# Patient Record
Sex: Female | Born: 1980 | Race: White | Hispanic: No | Marital: Married | Smoking: Never smoker
Health system: Southern US, Community
[De-identification: ages and names within clinical notes are randomized; demographics above are authoritative.]

## PROBLEM LIST (undated history)

## (undated) DIAGNOSIS — I10 Essential (primary) hypertension: Secondary | ICD-10-CM

## (undated) HISTORY — PX: CHOLECYSTECTOMY: SHX55

---

## 2005-04-21 ENCOUNTER — Inpatient Hospital Stay (HOSPITAL_COMMUNITY): Admission: AD | Admit: 2005-04-21 | Discharge: 2005-04-23 | Payer: Self-pay | Admitting: Obstetrics & Gynecology

## 2005-07-22 ENCOUNTER — Inpatient Hospital Stay (HOSPITAL_COMMUNITY): Admission: EM | Admit: 2005-07-22 | Discharge: 2005-07-24 | Payer: Self-pay | Admitting: Emergency Medicine

## 2005-07-23 ENCOUNTER — Encounter (INDEPENDENT_AMBULATORY_CARE_PROVIDER_SITE_OTHER): Payer: Self-pay | Admitting: General Surgery

## 2006-02-21 ENCOUNTER — Other Ambulatory Visit: Admission: RE | Admit: 2006-02-21 | Discharge: 2006-02-21 | Payer: Self-pay | Admitting: Obstetrics & Gynecology

## 2006-08-12 IMAGING — CT CT PELVIS W/ CM
1 of 3 series · 14 of 32 positions shown, 19 images · IV contrast (CONTRAST)
Comparison: none

CLINICAL DATA: Mid upper abdominal pain.
 ABDOMEN CT WITH CONTRAST:
TECHNIQUE: Multidetector CT imaging of the abdomen was performed following the standard protocol during bolus administration of intravenous contrast.
 Contrast:  150 cc Omnipaque 300.
 The liver, spleen, pancreas, adrenal glands, and kidneys appear normal.  There are no dilated loops of large or small bowel. There is a tiny umbilical hernia containing only fat. Terminal ileum is normal.
TECHNIQUE: Multidetector CT imaging of the pelvis was performed following the standard protocol during bolus administration of intravenous contrast.
 There is a 3.4 cm cyst on the right ovary and a 4.0 cm cyst on the left ovary.  The uterus is normal.  No free fluid or other significant abnormality.  The appendix appears normal.

[Series 6210: — · axial · 0.88mm/px · z∈[+1380,+1845]mm · 14 of 105 slices shown, 19 images]
[im 6/105  soft-tissue]
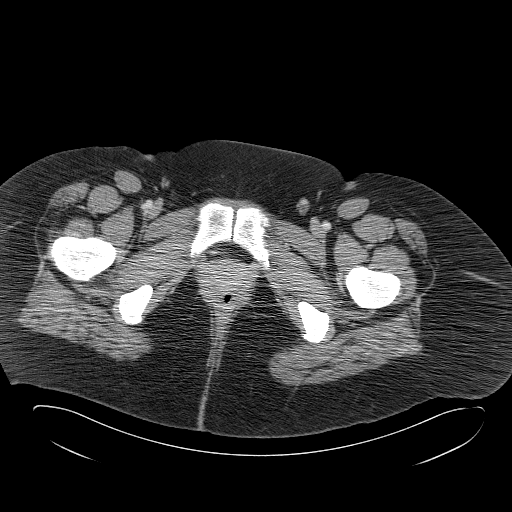
[im 6/105  bone]
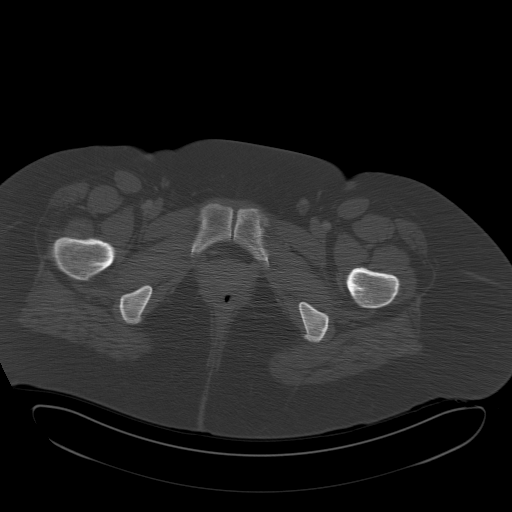
[im 12/105  soft-tissue]
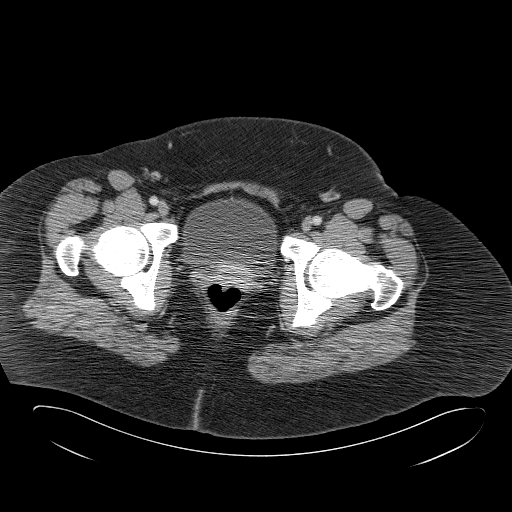
[im 24/105  soft-tissue]
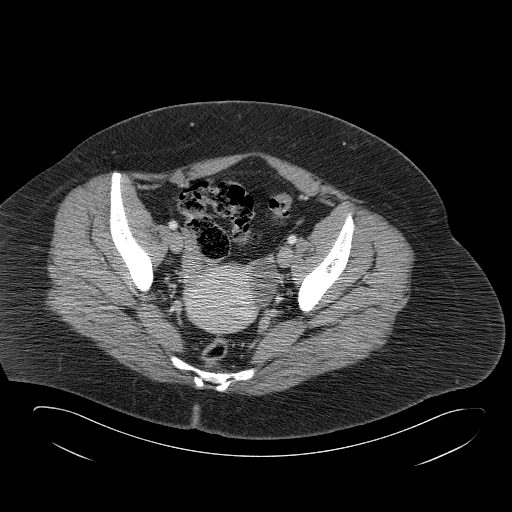
[im 29/105  soft-tissue]
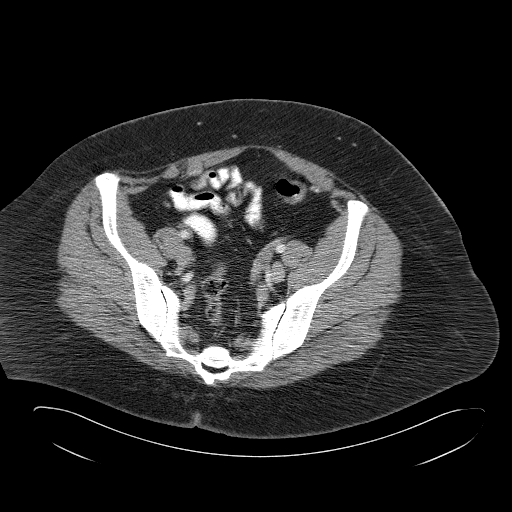
[im 35/105  soft-tissue]
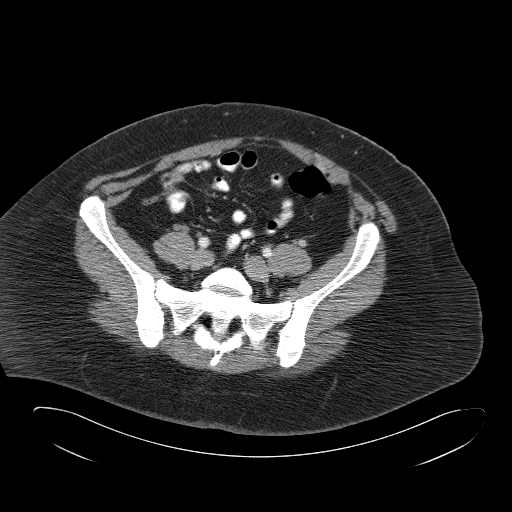
[im 47/105  soft-tissue]
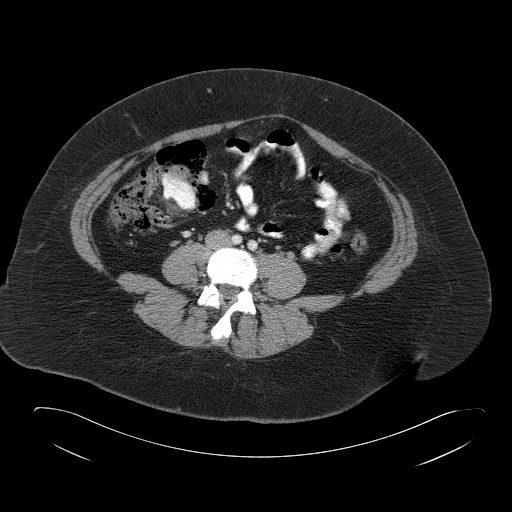
[im 53/105  soft-tissue]
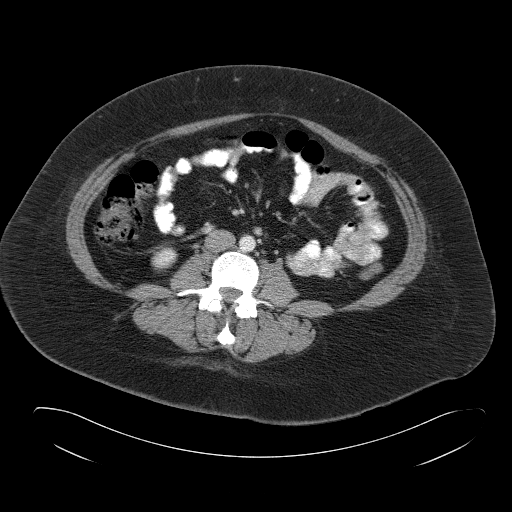
[im 58/105  soft-tissue]
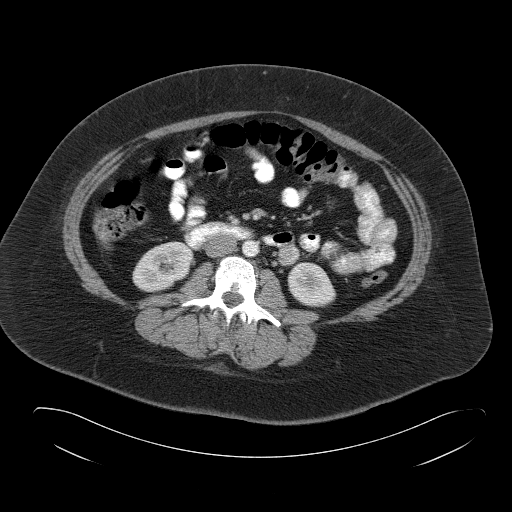
[im 70/105  soft-tissue]
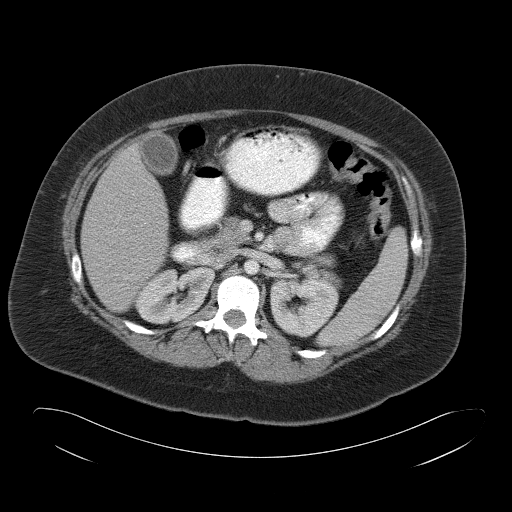
[im 70/105  bone]
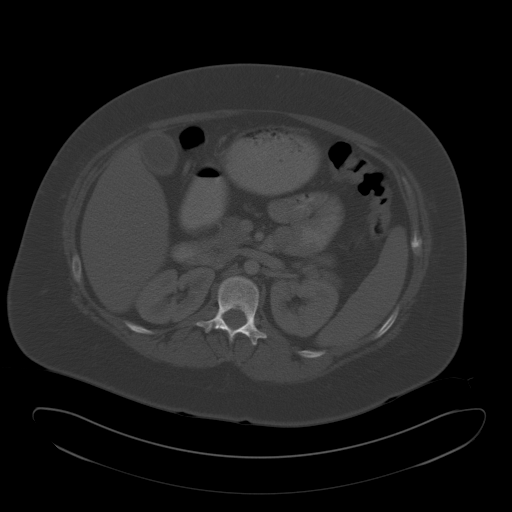
[im 76/105  soft-tissue]
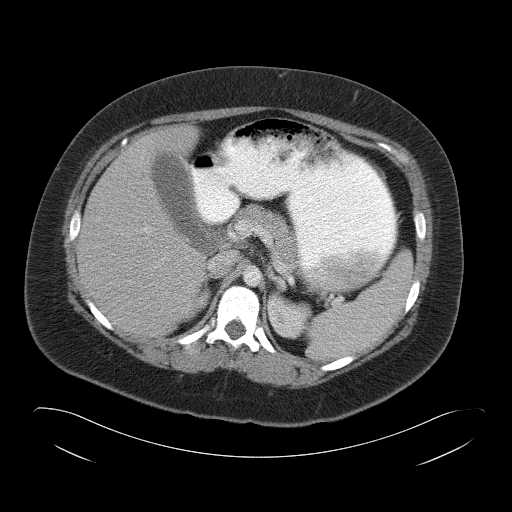
[im 81/105  soft-tissue]
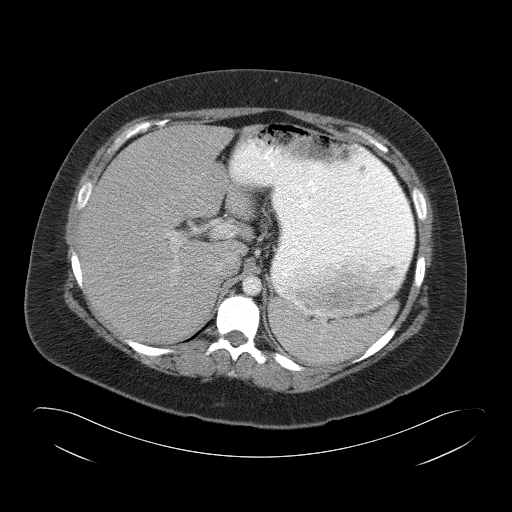
[im 81/105  lung]
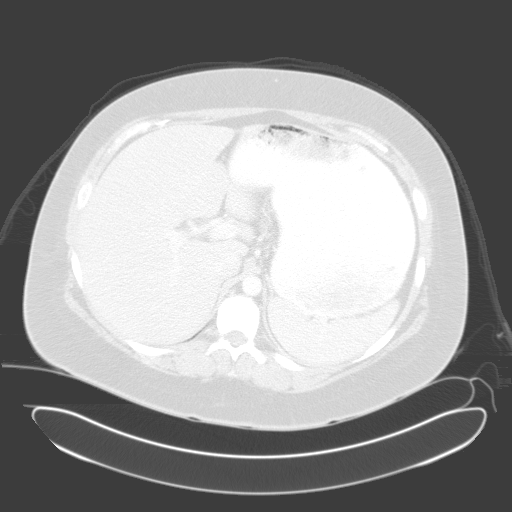
[im 87/105  lung]
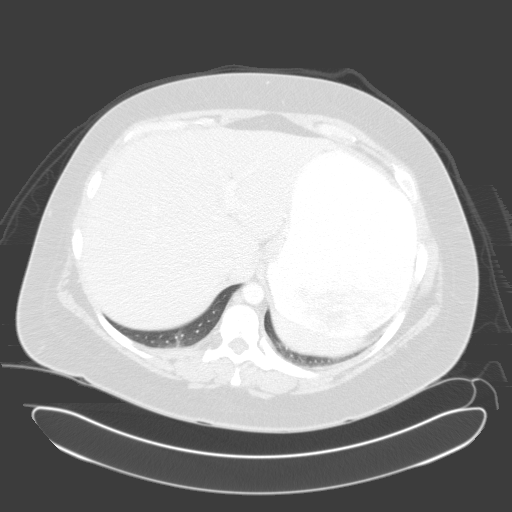
[im 93/105  soft-tissue]
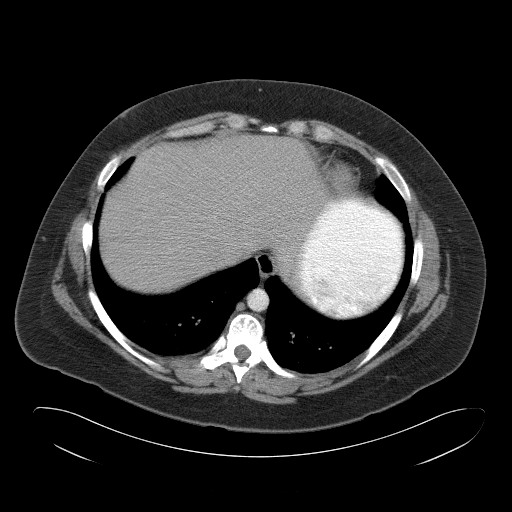
[im 93/105  lung]
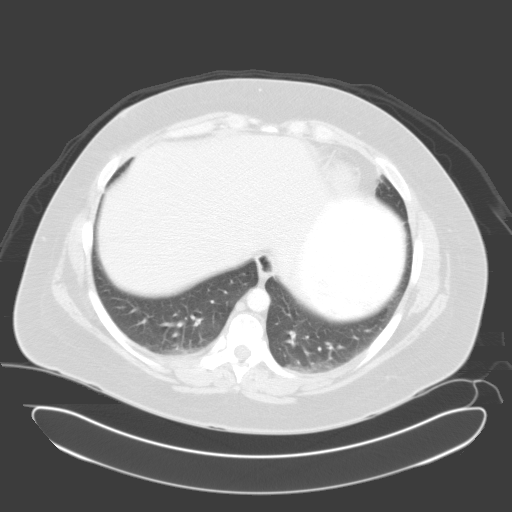
[im 99/105  soft-tissue]
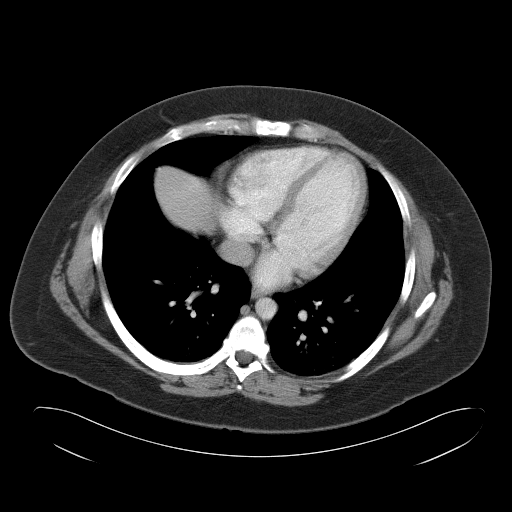
[im 99/105  lung]
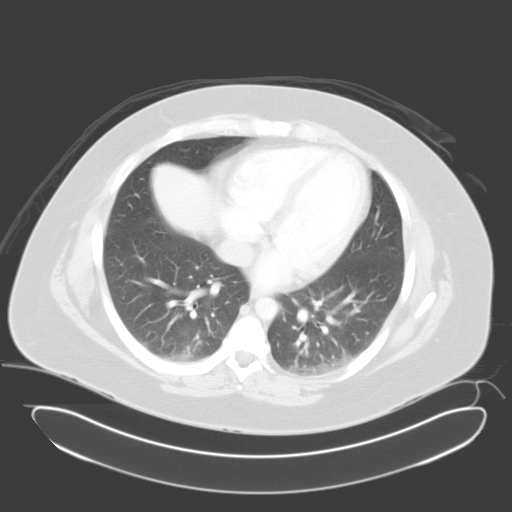

[14 of 32 positions shown; findings below may reference images not displayed]

IMPRESSION: Normal CT scan of the abdomen.
 PELVIS CT WITH CONTRAST:
IMPRESSION: 1.  Bilateral ovarian cyst.
 2.  Otherwise normal exam.

## 2009-05-04 ENCOUNTER — Emergency Department (HOSPITAL_COMMUNITY): Admission: EM | Admit: 2009-05-04 | Discharge: 2009-05-04 | Payer: Self-pay | Admitting: Emergency Medicine

## 2011-01-09 ENCOUNTER — Encounter: Payer: Self-pay | Admitting: General Surgery

## 2011-01-09 ENCOUNTER — Encounter: Payer: Self-pay | Admitting: Obstetrics and Gynecology

## 2011-05-06 NOTE — Op Note (Signed)
NAMEMARLENNE, [name]           ACCOUNT NO.:  [id number]   MEDICAL RECORD NO.:  [id number]          PATIENT TYPE:  INP   LOCATION:  A302                          FACILITY:  [facility]   PHYSICIAN:  Jerolyn Shin C. Katrinka Blazing, M.D.   DATE OF BIRTH:  1980/12/28   DATE OF PROCEDURE:  07/23/2005  DATE OF DISCHARGE:                                 OPERATIVE REPORT   PREOPERATIVE DIAGNOSIS:  Acute cholecystitis with cholelithiasis.   POSTOPERATIVE DIAGNOSIS:  Acute cholecystitis, cholelithiasis.   PROCEDURE:  Laparoscopic cholecystectomy.   SURGEON:  Dr. Katrinka Blazing.   DESCRIPTION:  Under general endotracheal anesthesia, the patient's abdomen  was prepped and draped in a sterile field. Supraumbilical incision was made,  and Veress needle was inserted uneventfully. Abdomen was insufflated with 2  liters of CO2. Using an Visiport guide, a 10-mm port was placed  uneventfully. Laparoscope was placed, and gallbladder was visualized. The  patient was positioned in reverse Trendelenburg position. Under videoscopic  guidance, a 10-mm port and two 5-mm ports were placed uneventfully. The  gallbladder was grasped and positioned. The cystic duct was isolated,  dissected, clipped with five clips and divided. There were two cystic artery  branches. Each was clipped with three clips and divided. There was  significant edema of the gallbladder. There was some small vessels in the  bed of the gallbladder that had to be clipped. There was no significant  bleeding. Using electrocautery, gallbladder was separated from the  infrahepatic bed without difficulty. It was placed in an EndoCatch device  and retrieved. There was no purulence noted. There was no bile leak, small  spill. The irrigation fluid returned clear. CO2 was allowed to escape from  the abdomen, and the ports were removed. The incisions were closed using 0  Vicryl on the supraumbilical incision and staples on all the skin incisions.  The patient tolerated the  procedure well. Sterile dressings were placed. She  was awakened from anesthesia uneventfully, transferred to a bed and taken to  the postanesthetic care unit.      Dirk Dress. Katrinka Blazing, M.D.  Electronically Signed     LCS/MEDQ  D:  07/23/2005  T:  07/23/2005  Job:  [id number]

## 2011-05-06 NOTE — Op Note (Signed)
NAMEJEARLEAN, [name]           ACCOUNT NO.:  [id number]   MEDICAL RECORD NO.:  [id number]          PATIENT TYPE:  INP   LOCATION:  A412                          FACILITY:  [facility]   PHYSICIAN:  Lazaro Arms, M.D.   DATE OF BIRTH:  Nov 22, 1981   DATE OF PROCEDURE:  DATE OF DISCHARGE:  04/23/2005                                 OPERATIVE REPORT   PROCEDURE:  Epidural.   OPERATOR:  Lazaro Arms, M.D.   INDICATIONS FOR PROCEDURE:  [name] is a 30 year old gravida 2, para 1,  who is in active phase of labor.  She underwent a __________  placement last  night and dose induction today.  She is requesting an epidural for the pain  and labor management.   DESCRIPTION OF PROCEDURE:  The patient is placed in the sitting position.  Betadine prep is used.  Lidocaine 1% is injected as a local anesthetic.  The  area is still draped.  A #17 gauge Tuohy needle is introduced into the L2-L3  interspace, and a loss of resistance technique employed.  Then 10 mL of  0.125% bupivacaine plain is given into the epidural space without difficulty  as a test dose without ill effects.  Additionally, the epidural catheter is  fit 5 cm into the epidural space in takedown.  An additional 10 mL to bolus  of the epidural was given of 0.125% bupivacaine plain.  The continuous  infusion of 0.125% bupivacaine plain with 2 mcg per mL of fentanyl was begun  at 12 mL an hour.  The fetal heart rate tracing is stable.  The maternal  blood pressure is stable, and the patient is beginning to get good pain  relief.      LHE/MEDQ  D:  05/11/2005  T:  05/11/2005  Job:  [id number]

## 2011-05-06 NOTE — H&P (Signed)
NAME:  [name]           ACCOUNT NO.:  [id number]   MEDICAL RECORD NO.:  [id number]          PATIENT TYPE:  INP   LOCATION:  LDRP                          FACILITY:  [facility]   PHYSICIAN:  Lazaro Arms, M.D.   DATE OF BIRTH:  [birth year]   DATE OF ADMISSION:  DATE OF DISCHARGE:  LH                                HISTORY & PHYSICAL   [name] is a 30 year old white female, gravida 2, para 1, estimated date of  delivery Apr 26, 2005, by last menstrual period and 9-week sonogram,  currently at 39-4/[redacted] weeks gestation who is admitted for Foley bulk ripening  and induction of labor.  She has a history of preeclampsia last pregnancy.  Blood pressure is fine here in the office, a little borderline towards 130s  to 144/80 range.  She has had no protein, and her evaluations have been  normal.  She presents to the office on May 2 at 2 cm, still pretty thick and  long, -2 to -3 station, vertex, soft, and posterior.  Because of her history  of preeclampsia at this point and relatively favorable cervix, we are going  to proceed with Foley bulb ripening and induction of labor.   PAST MEDICAL HISTORY:  Negative.   PAST SURGICAL HISTORY:  Negative.   P0AST OB HISTORY:  She delivered in 1998 at 42 weeks.  She had an epidural  up in [place].   REVIEW OF SYSTEMS:  Otherwise negative.   PRENATAL LABORATORY DATA:  Blood type A positive.  Rubella immune.  Hepatitis B negative, HIV nonreactive, HSV-1 negative, HSV-2 negative.  Serology nonreactive.  Pap normal.  GC and chlamydia negative.  At 36 weeks  also, her Glucola is 106.  AFP was normal.   PHYSICAL EXAMINATION:  HEENT:  Unremarkable.  NECK:  Thyroid normal.  LUNGS:  Clear.  HEART:  Regular rate and rhythm without murmur, regurgitation, or gallop.  BREASTS:  Deferred.  ABDOMEN:  Last fundal height 46 cm.  PELVIC:  Cervix 2, soft, posterior, thick, -2 to -3, vertex.  EXTREMITIES:  Warm with 1+ edema.   IMPRESSION:  1.  Intrauterine  pregnancy at 39+ weeks gestation.  2.  Favorable cervix.   PLAN:  Patient admitted for Foley bulb ripening and Pitocin induction of  labor.  She understands indications and will proceed.      LHE/MEDQ  D:  04/19/2005  T:  04/19/2005  Job:  [id number]

## 2011-05-06 NOTE — H&P (Signed)
NAME:  [name], [name]           ACCOUNT NO.:  [id number]   MEDICAL RECORD NO.:  [id number]          PATIENT TYPE:  AMB   LOCATION:  DAY                           FACILITY:  [facility]   PHYSICIAN:  Tilda Burrow, M.D. DATE OF BIRTH:  12/06/1981   DATE OF ADMISSION:  DATE OF DISCHARGE:  LH                                HISTORY & PHYSICAL   ADMITTING DIAGNOSIS:  Desire for elective permanent sterilization.   This 30 year old female, gravida 2, para 2, now six weeks status post  permanent sterilization.  She signed appropriate consent forms, understands  the permanency of the requested procedure, and acknowledges failure rate of  1 in 100.   PAST MEDICAL HISTORY:  Benign.   SURGICAL HISTORY:  Negative.   OBSTETRICAL HISTORY:  1.  Two vaginal deliveries.  2.  Blood A positive.   PHYSICAL EXAMINATION:  GENERAL:  Shows a healthy, large framed, Caucasian  female.  VITAL SIGNS:  Height 5 foot 6 inches, weight 256.5, blood pressure 126/72.  HEENT:  Shows pupils are equal, round, and reactive.  NECK:  Supple.  CARDIOVASCULAR:  Unremarkable.  ABDOMEN:  Obese, nontender.  EXTREMITIES:  Within normal limits.   The patient states specifically that she has not resumed sexual activity at  this time and will not do so until after tubal sterilization.   PLAN:  Laparoscopic tubal sterilization, Falope rings to be performed on 30 May 2005.       JVF/MEDQ  D:  05/25/2005  T:  05/25/2005  Job:  [id number]

## 2011-05-06 NOTE — Op Note (Signed)
NAME:  [name], [name]           ACCOUNT NO.:  [id number]   MEDICAL RECORD NO.:  [id number]          PATIENT TYPE:  INP   LOCATION:  LDR2                          FACILITY:  [facility]   PHYSICIAN:  Lazaro Arms, M.D.   DATE OF BIRTH:  1981/01/12   DATE OF PROCEDURE:  04/22/2005  DATE OF DISCHARGE:                                 OPERATIVE REPORT   DELIVERY NOTE:  [name] had some trouble with some repetitive variables  for several hours, so we were going up and down, off and on with Pitocin,  trying to keep the baby happy yet trying to keep the uterus contracting  enough to get the baby out.  At approximately 1215, she had some bradycardia  down to the 60s-70s, which just kind of stayed there, so despite oxygen and  position and changes, IV fluids, the heart rate did come up with some scalp  stimulation.  She did progress very rapidly at that point and went from  basically a 7 to 10 and +3 station within five minutes.  She pushed very  effectively and after about two or three contractions, she delivered a  viable female infant at 70.  The baby came out absolutely screaming.  Weight  is 7 pounds 2 ounces, Apgars are 9 and 10.  Twenty units of Pitocin diluted  in 1000 mL of lactated Ringer's was infused rapidly IV.  The placenta  separated spontaneously and delivered via controlled cord traction at 1232.  It was inspected and appears to be intact with a three-vessel cord.  The  vagina was inspected, and no lacerations were found.  Estimated blood loss  200 mL.  The epidural catheter was then removed with the blue tip visualized  as being intact.      FC/MEDQ  D:  04/22/2005  T:  04/22/2005  Job:  [id number]

## 2011-05-06 NOTE — H&P (Signed)
[name], [name]           ACCOUNT NO.:  [id number]   MEDICAL RECORD NO.:  [id number]          PATIENT TYPE:  INP   LOCATION:  A302                          FACILITY:  [facility]   PHYSICIAN:  Jerolyn Shin C. Katrinka Blazing, M.D.   DATE OF BIRTH:  1981-04-11   DATE OF ADMISSION:  07/22/2005  DATE OF DISCHARGE:  LH                                HISTORY & PHYSICAL   A 30 year old female who is three months postpartum with a history of  recurrent post prandial bloating and gas.  Last week she had severe right  upper quadrant squeezing type pain that radiated through to her back.  She  had nausea without vomiting.  She has had symptoms that are made worse with  all greasy fried foods.  There is a strong family history of gallstone  disease.  The patient presented to the emergency room today complaining of  severe pain.  She felt that she was going to have a heart attack.  She was  already scheduled to have an ultrasound.  The ultrasound showed that the  patient had multiple gallstones in the gallbladder with heavy sludge with  thickening of the gallbladder wall compatible with acute cholecystitis.  She  is admitted and started on IV antibiotics and will have a cholecystectomy in  the morning.   PAST HISTORY:  Other than being post partum, the patient has no significant  medical history.   MEDICATIONS:  Her only medication is Ortho Tri-Cyclen Low and Phenergan.   She has no other medical illness.   FAMILY HISTORY:  Positive for hypertension and heart disease.   SOCIAL HISTORY:  She is divorced.  She smokes a half pack of cigarettes per  day.  She does not drink or use drugs.   PHYSICAL EXAMINATION:  VITAL SIGNS:  Blood pressure 150/90, pulse 76,  respirations 20, weight 255 pounds.  HEENT:  Unremarkable.  NECK:  Supple.  No JVD, bruit, adenopathy, or thyromegaly.  CHEST:  Clear to auscultation.  HEART:  Regular rate and rhythm without murmur, gallop, or rub.  ABDOMEN:  Obese but soft.   Moderate epigastric and right upper quadrant  tenderness with positive Murphy sign.  EXTREMITIES:  No cyanosis, clubbing, or edema.  NEUROLOGIC:  No focal motor, sensory, or cerebellar deficit.   IMPRESSION:  Cholelithiasis with acute cholecystitis.   PLAN:  Laparoscopic cholecystectomy.   [facility] floor [facility] for emergency surgery in the morning.      Dirk Dress. Katrinka Blazing, M.D.  Electronically Signed     LCS/MEDQ  D:  07/22/2005  T:  07/22/2005  Job:  [id number]

## 2012-12-19 ENCOUNTER — Emergency Department (HOSPITAL_COMMUNITY): Payer: BC Managed Care – PPO

## 2012-12-19 ENCOUNTER — Emergency Department (HOSPITAL_COMMUNITY)
Admission: EM | Admit: 2012-12-19 | Discharge: 2012-12-19 | Disposition: A | Discharge status: Home / Self Care | Payer: BC Managed Care – PPO | Attending: Emergency Medicine | Admitting: Emergency Medicine

## 2012-12-19 ENCOUNTER — Encounter (HOSPITAL_COMMUNITY): Payer: Self-pay

## 2012-12-19 DIAGNOSIS — Z79899 Other long term (current) drug therapy: Secondary | ICD-10-CM | POA: Insufficient documentation

## 2012-12-19 DIAGNOSIS — I10 Essential (primary) hypertension: Secondary | ICD-10-CM | POA: Insufficient documentation

## 2012-12-19 DIAGNOSIS — IMO0001 Reserved for inherently not codable concepts without codable children: Secondary | ICD-10-CM | POA: Insufficient documentation

## 2012-12-19 DIAGNOSIS — R0602 Shortness of breath: Secondary | ICD-10-CM | POA: Insufficient documentation

## 2012-12-19 DIAGNOSIS — R059 Cough, unspecified: Secondary | ICD-10-CM | POA: Insufficient documentation

## 2012-12-19 DIAGNOSIS — R05 Cough: Secondary | ICD-10-CM | POA: Insufficient documentation

## 2012-12-19 DIAGNOSIS — J111 Influenza due to unidentified influenza virus with other respiratory manifestations: Secondary | ICD-10-CM | POA: Insufficient documentation

## 2012-12-19 HISTORY — DX: Essential (primary) hypertension: I10

## 2012-12-19 MED ORDER — GUAIFENESIN-CODEINE 100-10 MG/5ML PO SYRP: ORAL_SOLUTION | ORAL | Status: DC

## 2012-12-19 NOTE — ED Notes (Signed)
Pt reports cough, headache,  Generalized body aches since Friday.  Reports had some diarrhea, last episode was Sunday.  Denies vomiting.

## 2012-12-19 NOTE — ED Provider Notes (Signed)
History     CSN: [id number]  Arrival date & time 12/19/12  1209   First MD Initiated Contact with Patient 12/19/12 1334      No chief complaint on file.   (Consider location/radiation/quality/duration/timing/severity/associated sxs/prior treatment) HPI Comments: Cough, fever and flu-like sxs x 5 days.  The history is provided by the patient. No language interpreter was used.    Past Medical History  Diagnosis Date  . Hypertension     Past Surgical History  Procedure Date  . Cholecystectomy     No family history on file.  History  Substance Use Topics  . Smoking status: Never Smoker   . Smokeless tobacco: Not on file  . Alcohol Use: No    OB History    Grav Para Term Preterm Abortions TAB SAB Ect Mult Living                  Review of Systems  Constitutional: Positive for fever and chills.  Respiratory: Positive for cough and shortness of breath. Negative for wheezing.   Musculoskeletal: Positive for myalgias.  All other systems reviewed and are negative.    Allergies  Review of patient's allergies indicates no known allergies.  Home Medications   Current Outpatient Rx  Name  Route  Sig  Dispense  Refill  . DM-GUAIFENESIN ER 30-600 MG PO TB12   Oral   Take 1 tablet by mouth every 12 (twelve) hours.         . NORETHINDRONE 0.35 MG PO TABS   Oral   Take 1 tablet by mouth daily.         Marland Kitchen PHENYLEPHRINE-DM-GG-APAP 5-10-200-325 MG PO TABS   Oral   Take 1 tablet by mouth every 4 (four) hours as needed. Flu like symptoms.         Marland Kitchen PSEUDOEPH-DOXYLAMINE-DM-APAP 60-12.05-17-999 MG/30ML PO LIQD   Oral   Take 30 mLs by mouth every 4 (four) hours as needed. Flu like symptoms.         . GUAIFENESIN-CODEINE 100-10 MG/5ML PO SYRP      10 mls po q 4-6 hrs prn cough   240 mL   0     BP 148/105  Pulse 94  Temp 98.9 F (37.2 C) (Oral)  Resp 18  Ht 5\' 6"  (1.676 m)  Wt 280 lb (127.007 kg)  BMI 45.19 kg/m2  SpO2 100%  LMP  12/12/2012  Physical Exam  Nursing note and vitals reviewed. Constitutional: She is oriented to person, place, and time. She appears well-developed and well-nourished. No distress.  HENT:  Head: Normocephalic and atraumatic.  Eyes: EOM are normal.  Neck: Normal range of motion.  Cardiovascular: Normal rate and regular rhythm.   Pulmonary/Chest: Effort normal and breath sounds normal. No accessory muscle usage. Not tachypneic. No respiratory distress. She has no decreased breath sounds. She has no wheezes. She has no rhonchi. She has no rales. She exhibits no tenderness.  Abdominal: Soft. She exhibits no distension. There is no tenderness.  Musculoskeletal: Normal range of motion.  Neurological: She is alert and oriented to person, place, and time.  Skin: Skin is warm and dry.  Psychiatric: She has a normal mood and affect. Judgment normal.    ED Course  Procedures (including critical care time)  Labs Reviewed - No data to display Dg Chest 2 View  12/19/2012  *RADIOLOGY REPORT*  Clinical Data: Fatigue, cough and congestion  CHEST - 2 VIEW  Comparison: None.  Findings: The heart size and  mediastinal contours are within normal limits.  Both lungs are clear.  The visualized skeletal structures are unremarkable.  IMPRESSION: No active cardiopulmonary abnormalities.   Original Report Authenticated By: Signa Kell, M.D.      1. Influenza       MDM  rx-robitussin [facility] F/u with PCP        Evalina Field, PA 12/19/12 1510

## 2012-12-21 NOTE — ED Provider Notes (Signed)
Medical screening examination/treatment/procedure(s) were performed by non-physician practitioner and as supervising physician I was immediately available for consultation/collaboration.   Janith Nielson W. Doron Shake, MD 12/21/12 2040 

## 2013-01-24 ENCOUNTER — Ambulatory Visit: Payer: Self-pay | Admitting: Otolaryngology

## 2013-02-12 ENCOUNTER — Ambulatory Visit: Payer: Self-pay | Admitting: Otolaryngology

## 2013-04-24 ENCOUNTER — Ambulatory Visit: Payer: Self-pay | Admitting: Gastroenterology

## 2016-02-09 ENCOUNTER — Encounter (HOSPITAL_COMMUNITY): Payer: Self-pay | Admitting: Emergency Medicine

## 2016-02-09 ENCOUNTER — Emergency Department (HOSPITAL_COMMUNITY)
Admission: EM | Admit: 2016-02-09 | Discharge: 2016-02-09 | Disposition: A | Discharge status: Home / Self Care | Payer: Self-pay | Attending: Emergency Medicine | Admitting: Emergency Medicine

## 2016-02-09 DIAGNOSIS — I10 Essential (primary) hypertension: Secondary | ICD-10-CM | POA: Insufficient documentation

## 2016-02-09 DIAGNOSIS — J111 Influenza due to unidentified influenza virus with other respiratory manifestations: Secondary | ICD-10-CM | POA: Insufficient documentation

## 2016-02-09 DIAGNOSIS — R232 Flushing: Secondary | ICD-10-CM | POA: Insufficient documentation

## 2016-02-09 DIAGNOSIS — Z79899 Other long term (current) drug therapy: Secondary | ICD-10-CM | POA: Insufficient documentation

## 2016-02-09 DIAGNOSIS — R69 Illness, unspecified: Secondary | ICD-10-CM

## 2016-02-09 NOTE — ED Notes (Signed)
Pt started feeling bad with body aches, cough and congestion 3 days ago.  Started running fevers today.

## 2016-02-09 NOTE — ED Notes (Signed)
Patient complaining of body aches, cough, nasal congestion, and fever since 3 days. States she took 800 mg ibuprofen at 1900.

## 2016-02-09 NOTE — Discharge Instructions (Signed)
Influenza, Adult Influenza ("the flu") is a viral infection of the respiratory tract. It occurs more often in winter months because people spend more time in close contact with one another. Influenza can make you feel very sick. Influenza easily spreads from person to person (contagious). CAUSES  Influenza is caused by a virus that infects the respiratory tract. You can catch the virus by breathing in droplets from an infected person's cough or sneeze. You can also catch the virus by touching something that was recently contaminated with the virus and then touching your mouth, nose, or eyes. RISKS AND COMPLICATIONS You may be at risk for a more severe case of influenza if you smoke cigarettes, have diabetes, have chronic heart disease (such as heart failure) or lung disease (such as asthma), or if you have a weakened immune system. Elderly people and pregnant women are also at risk for more serious infections. The most common problem of influenza is a lung infection (pneumonia). Sometimes, this problem can require emergency medical care and may be life threatening. SIGNS AND SYMPTOMS  Symptoms typically last 4 to 10 days and may include:  Fever.  Chills.  Headache, body aches, and muscle aches.  Sore throat.  Chest discomfort and cough.  Poor appetite.  Weakness or feeling tired.  Dizziness.  Nausea or vomiting. DIAGNOSIS  Diagnosis of influenza is often made based on your history and a physical exam. A nose or throat swab test can be done to confirm the diagnosis. TREATMENT  In mild cases, influenza goes away on its own. Treatment is directed at relieving symptoms. For more severe cases, your health care provider may prescribe antiviral medicines to shorten the sickness. Antibiotic medicines are not effective because the infection is caused by a virus, not by bacteria. HOME CARE INSTRUCTIONS  Take medicines only as directed by your health care provider.  Use a cool mist humidifier  to make breathing easier.  Get plenty of rest until your temperature returns to normal. This usually takes 3 to 4 days.  Drink enough fluid to keep your urine clear or pale yellow.  Cover yourmouth and nosewhen coughing or sneezing,and wash your handswellto prevent thevirusfrom spreading.  Stay homefromwork orschool untilthe fever is gonefor at least 57full day. PREVENTION  An annual influenza vaccination (flu shot) is the best way to avoid getting influenza. An annual flu shot is now routinely recommended for all adults in the [place] SEEK MEDICAL CARE IF:  You experiencechest pain, yourcough worsens,or you producemore mucus.  Youhave nausea,vomiting, ordiarrhea.  Your fever returns or gets worse. SEEK IMMEDIATE MEDICAL CARE IF:  You havetrouble breathing, you become short of breath,or your skin ornails becomebluish.  You have severe painor stiffnessin the neck.  You develop a sudden headache, or pain in the face or ear.  You have nausea or vomiting that you cannot control. MAKE SURE YOU:   Understand these instructions.  Will watch your condition.  Will get help right away if you are not doing well or get worse.   This information is not intended to replace advice given to you by your health care provider. Make sure you discuss any questions you have with your health care provider.   Document Released: 12/02/2000 Document Revised: 12/26/2014 Document Reviewed: 03/05/2012 Elsevier Interactive Patient Education 2016 [employer].   Emergency Department Resource Guide 1) Find a Doctor and Pay Out of Pocket Although you won't have to find out who is covered by your insurance plan, it is a good idea to  ask around and get recommendations. You will then need to call the office and see if the doctor you have chosen will accept you as a new patient and what types of options they offer for patients who are self-pay. Some doctors offer discounts or will set up  payment plans for their patients who do not have insurance, but you will need to ask so you aren't surprised when you get to your appointment.  2) Contact Your Local Health Department Not all health departments have doctors that can see patients for sick visits, but many do, so it is worth a call to see if yours does. If you don't know where your local health department is, you can check in your phone book. The CDC also has a tool to help you locate your state's health department, and many state websites also have listings of all of their local health departments.  3) Find a Walk-in Clinic If your illness is not likely to be very severe or complicated, you may want to try a walk in clinic. These are popping up all over the country in pharmacies, drugstores, and shopping centers. They're usually staffed by nurse practitioners or physician assistants that have been trained to treat common illnesses and complaints. They're usually fairly quick and inexpensive. However, if you have serious medical issues or chronic medical problems, these are probably not your best option.  No Primary Care Doctor: - Call Health Connect at  [phone] - they can help you locate a primary care doctor that  accepts your insurance, provides certain services, etc. - Physician Referral Service- [phone]  Chronic Pain Problems: Organization         Address  Phone   Notes  [facility]  [phone] Patients need to be referred by their primary care doctor.   Medication Assistance: Organization         Address  Phone   Notes  [facility] Medication [facility] [address] [place], [place] [address] [phone] --Must be a resident of [facility] -- Must have NO insurance coverage whatsoever (no Medicaid/ Medicare, etc.) -- The pt. MUST have a primary care doctor that directs their care regularly and follows them in the community   [facility]  [phone]   [employer]  [phone]    Agencies that provide inexpensive medical care: Organization         Address  Phone   Notes  [facility]  [phone]   [facility]    [phone]   [facility] [address] [place], [place] [address] [phone]   [facility] [address] [place] [address], [place] [phone]   Planned Parenthood    [phone]   [facility]    [phone]   Community [facility] and [facility]  [address], [place] Phone:  [phone], Fax:  [phone] Hours of Operation:  9 am - 6 pm, M-F.  Also accepts Medicaid/Medicare and self-pay.  [facility]  [address], [place] Phone: [phone], Fax: [phone]. Hours of Operation:  8:30 am - 5:30 pm, M-F.  Also accepts Medicaid and self-pay.  [facility] [place] [address] [place] Phone: [phone]   [facility] [address] Natasha Bence [place], [place] [phone], Ext. [phone] Mondays & Thursdays: 7-9 AM.  First 15 patients are  seen on a first come, first serve basis.    Medicaid-accepting [facility] Providers:  Organization         Address  Phone   Notes  [facility] [address], [place] [phone] Also accepts self-pay patients.  [facility] [address] Laurell Josephs [place], [place]  [phone]   [facility] [address], [place] [phone]   [facility] [address], [place] [phone]   Renaye Rakers [address], [place]   [phone] Only accepts [place] Access [place] patients after they have their name applied to their card.   Self-Pay (no insurance) in [facility]:  Organization         Address  Phone   Notes  Sickle Cell Patients, [facility] [address] [place], [place]  [phone]   [facility] Urgent Care [address] [place], [place] [phone]   [facility] [place]  [address], [place] [phone]   [facility]/Dr. Osei-Bonsu  [address], [place] or [address], [place] [phone] Phone number for both [place] and [place] locations is the same.  [facility] and [facility] [address] [place] [phone]    [facility]ospital [address], [place] or [address] [phone]   [facility] [address] [place] [phone], phone; [phone], fax Sees patients 1st and 3rd Saturday of every month.  Must not qualify for public or private insurance (i.e. Medicaid, Medicare, [place] Health Choice, Veterans' Benefits)  Household income should be no more than 200% of the poverty level The clinic cannot treat you if you are pregnant or think you are pregnant  Sexually transmitted diseases are not treated at the clinic.    Dental Care: Organization         Address  Phone  Notes  [facility] Department of [facility] [address] [place], [place] [phone] Accepts children up to age 49 who are enrolled in [place] or [place] Health Choice; pregnant women with a Medicaid card; and children who have applied for Medicaid or [place] Health Choice, but were declined, whose parents can pay a reduced fee at time of service.  [facility] Department of [facility]  [address], [place] [phone] Accepts children up to age 28 who are enrolled in [place] or [place] Health Choice; pregnant women with a Medicaid card; and children who have applied for Medicaid or [place] Health Choice, but were declined, whose parents can pay a reduced fee at time of service.  [facility]  [address] [place], [place] [phone] Patients  are seen by appointment only. Walk-ins are not accepted. [facility] will see patients 69 years of age and older. Monday - Tuesday (8am-5pm) Most Wednesdays (8:30-5pm) $30 per visit, cash only  [facility] Adult Dental Access [facility]  [address], [facility] [phone] Patients are seen by appointment only. Walk-ins are not accepted. [facility] will see patients 77 years of age and older. One Wednesday Evening (Monthly: Volunteer Based).  $30 per visit, cash only  [employer]  [phone] for adults; Children under age 61, call Graduate Pediatric Dentistry at [phone])  [phone]. Children aged 55-14, please call [phone] to request a pediatric application.  Dental services are provided in all areas of dental care including fillings, crowns and bridges, complete and partial dentures, implants, gum treatment, root canals, and extractions. Preventive care is also provided. Treatment is provided to both adults and children. Patients are selected via a lottery and there is often a waiting list.   [facility] [address] [place]  [phone] [email or web address]   [facility] [address] [place], [place] [phone], Ext. [phone] Second and Fourth Thursday of each month, opens at 6:30 AM; Clinic ends at 9 AM.  Patients are seen on a first-come first-served basis, and a limited number are seen during each clinic.   [facility]  [address] Ether Griffins [place], [place] [phone]   Eligibility Requirements You must have lived in [place], [place], or [place] counties for at least the last three months.   You cannot be eligible for state or federal sponsored [place], including [employer], [place], or [employer].   You generally cannot be eligible for healthcare insurance through your employer.    How to apply: Eligibility screenings are held every Tuesday and Wednesday afternoon from 1:00 pm until  4:00 pm. You do not need an appointment for the interview!  [facility] [address] [place], [place] [phone]   [facility] Health Department  [phone]   [facility] Health Department  [phone]   [facility] Health Department  [phone]    Behavioral Health Resources in the Community: Intensive Outpatient Programs Organization         Address  Phone  Notes  [facility] [address] [place], [place] [phone]   [facility] Outpatient [address] [place], [place] [phone]   [facility] [address], [place], [place]  [phone]   [facility] Mental Health [address],  [place], [place] [phone] or [phone]   Substance Abuse Resources Organization         Address  Phone  Notes  [facility] Drug Services  [phone]   [facility]  [phone]   [facility] [place]  [phone]   Floydene Flock  [phone]   Residential & Outpatient Substance Abuse Program  [phone]   Psychological Services Organization         Address  Phone  Notes  [facility]  [phone]   [facility] Services  [phone]   [facility] [address] [place] [phone] or [phone]    Mobile Crisis Teams Organization         Address  Phone  Notes  [facility], Mobile Crisis Care Unit  [phone]   [facility]  [address] [place], [place] [phone]   Doristine Locks [address] [place] [phone]    Self-Help/Support Groups Organization         Address  Phone             Notes  [facility] [place] - variety of support groups  [phone]- [phone] Call for more information  Narcotics Anonymous (NA), [facility] [address], [employer] [place]  2 meetings at this location   Educational psychologist  Notes  [facility] [address],    [place]  [place]  [address], [place] [address]24, [place], [place]   [place], [place] [phone] Admissions: 8am-3pm M-F  Incentives Substance [place] [address],    [place], [place] [phone]   [facility] [address] [place], [place], [place]   [facility] [address],  [place], [place]   [facility] - Intensive Outpatient [address] Kristeen [address], [place], [place]   [facility] [place]  [place], [place] [phone] or [phone]   [facility] ([facility]) [address], [place], [place] Accepts Medicaid  [facility] [place] [address]  [place] [phone] Substance Abuse/Addiction Treatment   [facility] Organization         Address  Phone  Notes  [facility]  [phone]   Domenic Schwab, PhD [address] Arlis Porta [place], [place]   [phone] or [phone]   [place] [address] [place], [place] [phone]   [facility] [address] [place], [place] [phone] Insurance/Medicaid/sponsorship through [facility] and Families [address] [place]                                    [place], [place] [phone] [place] [address], [place] [phone]    Dr. Adele Schilder  [phone]   [facility] Dept. 1) [address], [place] 2) [place] 3)  [place] [address], [place] [phone]  [phone]   [place] [phone] or [phone] (After Hours)

## 2016-02-09 NOTE — ED Provider Notes (Signed)
CSN: [id number]     Arrival date & time 02/09/16  2119 History   By signing my name below, I, Arlan Organ, attest that this documentation has been prepared under the direction and in the presence of Glynn Octave, MD.  Electronically Signed: Arlan Organ, ED Scribe. 02/09/2016. 10:28 PM.   Chief Complaint  Patient presents with  . Nasal Congestion  . Fever  . Generalized Body Aches   The history is provided by the patient. No language interpreter was used.    HPI Comments: [name] is a 35 y.o. female with a PMHx of HTN who presents to the Emergency Department complaining of constant, ongoing nasal congestion x 2 days. Pt also reports a fever of 101.5, rhinorrhea, generalized body aches, and mild cough. No aggravating or alleviating factors at this time. OTC Ibuprofen attempted at home. Last dose administered at 7:00 PM this evening. No recent chest pain, shortness of breath, nausea, abdominal pain, diarrhea, dysuria, hematuria, or vomiting. Pt unsure of LNMP as she has Mirana placed.  PCP: No primary care provider on file.    Past Medical History  Diagnosis Date  . Hypertension    Past Surgical History  Procedure Laterality Date  . Cholecystectomy     History reviewed. No pertinent family history. Social History  Substance Use Topics  . Smoking status: Never Smoker   . Smokeless tobacco: None  . Alcohol Use: No   OB History    No data available     Review of Systems  A complete 10 system review of systems was obtained and all systems are negative except as noted in the HPI and PMH.    Allergies  Clindamycin/lincomycin  Home Medications   Prior to Admission medications   Medication Sig Start Date End Date Taking? Authorizing Provider  cetirizine (ZYRTEC) 10 MG tablet Take 10 mg by mouth daily as needed for allergies.   Yes Historical Provider, MD  fluticasone (FLONASE) 50 MCG/ACT nasal spray Place 2 sprays into both nostrils daily as needed for allergies  or rhinitis.   Yes Historical Provider, MD  guaiFENesin (MUCINEX) 600 MG 12 hr tablet Take 1,200 mg by mouth 2 (two) times daily as needed for cough or to loosen phlegm.   Yes Historical Provider, MD  hydrochlorothiazide (HYDRODIURIL) 25 MG tablet Take 25 mg by mouth daily. 11/29/15  Yes Historical Provider, MD  ibuprofen (ADVIL,MOTRIN) 200 MG tablet Take 200-400 mg by mouth every 6 (six) hours as needed for fever or moderate pain.   Yes Historical Provider, MD  levonorgestrel (MIRENA) 20 MCG/24HR IUD 1 each by Intrauterine route once.   Yes Historical Provider, MD  lisinopril (PRINIVIL,ZESTRIL) 20 MG tablet Take 20 mg by mouth daily. 11/29/15  Yes Historical Provider, MD   Triage Vitals: BP 130/61 mmHg  Pulse 72  Temp(Src) 98.8 F (37.1 C) (Oral)  Resp 18  Ht  (1.676 m)  Wt 245 lb (111.131 kg)  BMI 39.56 kg/m2  SpO2 99%   Physical Exam  Constitutional: She is oriented to person, place, and time. She appears well-developed and well-nourished. No distress.  Flushed face  HENT:  Head: Normocephalic and atraumatic.  Mouth/Throat: Oropharynx is clear and moist. No oropharyngeal exudate.  Eyes: Conjunctivae and EOM are normal. Pupils are equal, round, and reactive to light.  Neck: Normal range of motion. Neck supple.  No meningismus.  Cardiovascular: Normal rate, regular rhythm, normal heart sounds and intact distal pulses.   No murmur heard. Pulmonary/Chest: Effort normal and  breath sounds normal. No respiratory distress.  Abdominal: Soft. There is no tenderness. There is no rebound and no guarding.  Musculoskeletal: Normal range of motion. She exhibits no edema or tenderness.  Neurological: She is alert and oriented to person, place, and time. No cranial nerve deficit. She exhibits normal muscle tone. Coordination normal.  No ataxia on finger to nose bilaterally. No pronator drift. 5/5 strength throughout. CN 2-12 intact.Equal grip strength. Sensation intact.   Skin: Skin is warm.   Psychiatric: She has a normal mood and affect. Her behavior is normal.  Nursing note and vitals reviewed.   ED Course  Procedures (including critical care time)  DIAGNOSTIC STUDIES: Oxygen Saturation is 99% on RA, Normal by my interpretation.    COORDINATION OF CARE: 10:27 PM-Discussed treatment plan with pt at bedside and pt agreed to plan.     Labs Review Labs Reviewed - No data to display  Imaging Review No results found. I have personally reviewed and evaluated these images and lab results as part of my medical decision-making.   EKG Interpretation None      MDM   Final diagnoses:  Influenza-like illness   2 days of congestion, fever, bodyaches.  Did not receive flu shot.    Nontoxic appearing, moist mucus membranes, no meningismus, lungs clear.  Supportive care for influenza like illness. Offered tamiflu which was declined.  PO hydration, antipyretics, follow up with PCP. Return precautions discussed.     I personally performed the services described in this documentation, which was scribed in my presence. The recorded information has been reviewed and is accurate.   Glynn Octave, MD 02/10/16 [phone]

## 2016-02-09 NOTE — ED Notes (Signed)
Pt states understanding of care given and follow up instructions 

## 2016-06-17 ENCOUNTER — Emergency Department
Admission: EM | Admit: 2016-06-17 | Discharge: 2016-06-17 | Disposition: A | Discharge status: Home / Self Care | Payer: Self-pay | Attending: Emergency Medicine | Admitting: Emergency Medicine

## 2016-06-17 ENCOUNTER — Encounter: Payer: Self-pay | Admitting: Emergency Medicine

## 2016-06-17 ENCOUNTER — Emergency Department: Payer: Self-pay

## 2016-06-17 DIAGNOSIS — I10 Essential (primary) hypertension: Secondary | ICD-10-CM | POA: Insufficient documentation

## 2016-06-17 DIAGNOSIS — R059 Cough, unspecified: Secondary | ICD-10-CM

## 2016-06-17 DIAGNOSIS — Z7951 Long term (current) use of inhaled steroids: Secondary | ICD-10-CM | POA: Insufficient documentation

## 2016-06-17 DIAGNOSIS — R05 Cough: Secondary | ICD-10-CM | POA: Insufficient documentation

## 2016-06-17 DIAGNOSIS — Z791 Long term (current) use of non-steroidal anti-inflammatories (NSAID): Secondary | ICD-10-CM | POA: Insufficient documentation

## 2016-06-17 DIAGNOSIS — Z79899 Other long term (current) drug therapy: Secondary | ICD-10-CM | POA: Insufficient documentation

## 2016-06-17 LAB — CBC WITH DIFFERENTIAL/PLATELET
BASOS ABS: 0.1 10*3/uL (ref 0–0.1)
BASOS PCT: 1 %
Eosinophils Absolute: 0.2 10*3/uL (ref 0–0.7)
Eosinophils Relative: 2 %
HEMATOCRIT: 39.6 % (ref 35.0–47.0)
Hemoglobin: 13.6 g/dL (ref 12.0–16.0)
LYMPHS PCT: 21 %
Lymphs Abs: 2.2 10*3/uL (ref 1.0–3.6)
MCH: 27.1 pg (ref 26.0–34.0)
MCHC: 34.3 g/dL (ref 32.0–36.0)
MCV: 79.2 fL — AB (ref 80.0–100.0)
Monocytes Absolute: 0.6 10*3/uL (ref 0.2–0.9)
Monocytes Relative: 6 %
NEUTROS ABS: 7.4 10*3/uL — AB (ref 1.4–6.5)
Neutrophils Relative %: 70 %
PLATELETS: 279 10*3/uL (ref 150–440)
RBC: 5 MIL/uL (ref 3.80–5.20)
RDW: 14.6 % — ABNORMAL HIGH (ref 11.5–14.5)
WBC: 10.6 10*3/uL (ref 3.6–11.0)

## 2016-06-17 LAB — BASIC METABOLIC PANEL
ANION GAP: 8 (ref 5–15)
BUN: 13 mg/dL (ref 6–20)
CO2: 27 mmol/L (ref 22–32)
Calcium: 9.4 mg/dL (ref 8.9–10.3)
Chloride: 101 mmol/L (ref 101–111)
Creatinine, Ser: 0.67 mg/dL (ref 0.44–1.00)
GLUCOSE: 105 mg/dL — AB (ref 65–99)
POTASSIUM: 3 mmol/L — AB (ref 3.5–5.1)
Sodium: 136 mmol/L (ref 135–145)

## 2016-06-17 MED ORDER — PREDNISONE 20 MG PO TABS: 40.0000 mg | ORAL_TABLET | Freq: Every day | ORAL | Status: AC

## 2016-06-17 NOTE — ED Notes (Signed)
C/O cough x 2 months.  States cannot get rid of cough.  Initially cough was dry, now coughing productively thick green sputum.  Patient states she is feeling SOB.  PCP took patient off of lisinpril for 1 week and when cough did not stop, restarted.  Started on Doxycycline, Zyrtec, flonase, and tessalon Pearls.  States no relief of symptoms, only worsening.

## 2016-06-17 NOTE — ED Notes (Signed)
Pt. Reports cough the past 2 months, tx. By PCP for bronchitis, finished antibiotics, taking sudafed, zyrtec. Pt. Reports other allergy s/s developed over time (runny/stuffy nose). Pt. ststaes "at this point I'm just scared". Pt. Has seen PCP multiple times.

## 2016-06-17 NOTE — Discharge Instructions (Signed)

## 2016-06-17 NOTE — ED Provider Notes (Signed)
[facility] Emergency [facility] Provider Note  Time seen: 6:11 PM  I have reviewed the triage vital signs and the nursing notes.   HISTORY  Chief Complaint Cough    HPI Meriam SpragueConstance M Zani is a 35 y.o. female with a past medical history of hypertension who presents the emergency department with an ongoing cough for the past 2 months. According to the patient she has had a dry cough for the past 2 months but states over the past 2 weeks is now productive occasionally of a wider yellow sputum. She feels a sense of chest tightness with the cough, denies any fever, nausea, vomiting. Patient has seen her primary care doctor multiple times over the past 2 months for the cough. The patient was prescribed lisinopril, her PCP took her off his medication for 7 days with no improvement so she placed the patient back on lisinopril. Several weeks later the PCP take the patient back off lisinopril and place her on losartan. Patient has been off fosinopril for approximately 2 weeks at this point. Her PCP is also prescribed her a round of antibiotics, allergy medication, Tessalon Perles and codeine based cough medication. The patient states a cough medication is helping but she continues to have cough throughout the day. States the primary care doctor has not performed a chest x-ray yet, she became worried since the cough has not gone away and did not feel that her PCP was adequately addressing her concerns that she came to the emergency department for evaluation. Patient denies any chest pain at this time. States she doesn't feel short of breath she only feels short of breath when she is coughing.     Past Medical History  Diagnosis Date  . Hypertension     There are no active problems to display for this patient.   Past Surgical History  Procedure Laterality Date  . Cholecystectomy      Current Outpatient Rx  Name  Route  Sig  Dispense  Refill  . cetirizine (ZYRTEC) 10 MG  tablet   Oral   Take 10 mg by mouth daily as needed for allergies.         . fluticasone (FLONASE) 50 MCG/ACT nasal spray   Each Nare   Place 2 sprays into both nostrils daily as needed for allergies or rhinitis.         Marland Kitchen. guaiFENesin (MUCINEX) 600 MG 12 hr tablet   Oral   Take 1,200 mg by mouth 2 (two) times daily as needed for cough or to loosen phlegm.         . hydrochlorothiazide (HYDRODIURIL) 25 MG tablet   Oral   Take 25 mg by mouth daily.      12   . ibuprofen (ADVIL,MOTRIN) 200 MG tablet   Oral   Take 200-400 mg by mouth every 6 (six) hours as needed for fever or moderate pain.         Marland Kitchen. levonorgestrel (MIRENA) 20 MCG/24HR IUD   Intrauterine   1 each by Intrauterine route once.         Marland Kitchen. lisinopril (PRINIVIL,ZESTRIL) 20 MG tablet   Oral   Take 20 mg by mouth daily.      11     Allergies Clindamycin/lincomycin  No family history on file.  Social History Social History  Substance Use Topics  . Smoking status: Never Smoker   . Smokeless tobacco: None  . Alcohol Use: No    Review of Systems Constitutional: Negative  for fever Cardiovascular: Negative for chest pain. Occasional chest tightness. Respiratory: Shortness of breath with coughing spell. None at rest. Gastrointestinal: Negative for abdominal pain Musculoskeletal: Negative for back pain. Neurological: Negative for headache 10-point ROS otherwise negative.  ____________________________________________   PHYSICAL EXAM:  VITAL SIGNS: ED Triage Vitals  Enc Vitals Group     BP 06/17/16 1632 165/72 mmHg     Pulse Rate 06/17/16 1632 89     Resp 06/17/16 1632 18     Temp 06/17/16 1633 97.7 F (36.5 C)     Temp Source 06/17/16 1632 Oral     SpO2 06/17/16 1632 100 %     Weight 06/17/16 1632 302 lb (136.986 kg)     Height 06/17/16 1632 5\' 4"  (1.626 m)     Head Cir --      Peak Flow --      Pain Score 06/17/16 1634 0     Pain Loc --      Pain Edu? --      Excl. in GC? --      Constitutional: Alert and oriented. Well appearing and in no distress. Eyes: Normal exam ENT   Head: Normocephalic and atraumatic.   Mouth/Throat: Mucous membranes are moist. Cardiovascular: Normal rate, regular rhythm. No murmur Respiratory: Normal respiratory effort without tachypnea nor retractions. Breath sounds are clear. Frequent dry cough during examination. Gastrointestinal: Soft and nontender. No distention.   Musculoskeletal: Nontender with normal range of motion in all extremities.  Neurologic:  Normal speech and language. No gross focal neurologic deficits Skin:  Skin is warm, dry and intact.  Psychiatric: Mood and affect are normal.  ____________________________________________ Chest x-ray shows no acute abnormality.  EKG reviewed and interpreted by myself shows normal sinus rhythm at 84 bpm, narrow QRS, normal axis, normal intervals, no concerning ST changes noted.   INITIAL IMPRESSION / ASSESSMENT AND PLAN / ED COURSE  Pertinent labs & imaging results that were available during my care of the patient were reviewed by me and considered in my medical decision making (see chart for details).  Patient presents for 2 months of cough. Patient's labs are normal. Chest x-ray is normal. EKG is normal. Patient is currently taking allergy medication without improvement, completed a course of antibiotics approximately 2 weeks ago. I highly suspect the patient's cough is coming from her use of lisinopril, she has been off fosinopril for 1-2 weeks at this point. Patient can talk in full and complete sentences but anytime she takes a deep breath she has to cough stating that she feels irritated in her throat. Given the irritation causing much of the cough I believe the patient would benefit from a burst course of steroids. There is no sign of infectious etiology at this time. We'll discharge the patient home with PCP  follow-up.  ____________________________________________   FINAL CLINICAL IMPRESSION(S) / ED DIAGNOSES  Cough   Minna AntisKevin Romana Deaton, MD 06/17/16 1815

## 2017-07-08 IMAGING — CR DG CHEST 2V
2 series · 2 of 2 positions shown · non-contrast
Comparison: 12/19/2012

CLINICAL DATA: Chest pressure and shortness of Breath

EXAM:
CHEST  2 VIEW

[chest pa]
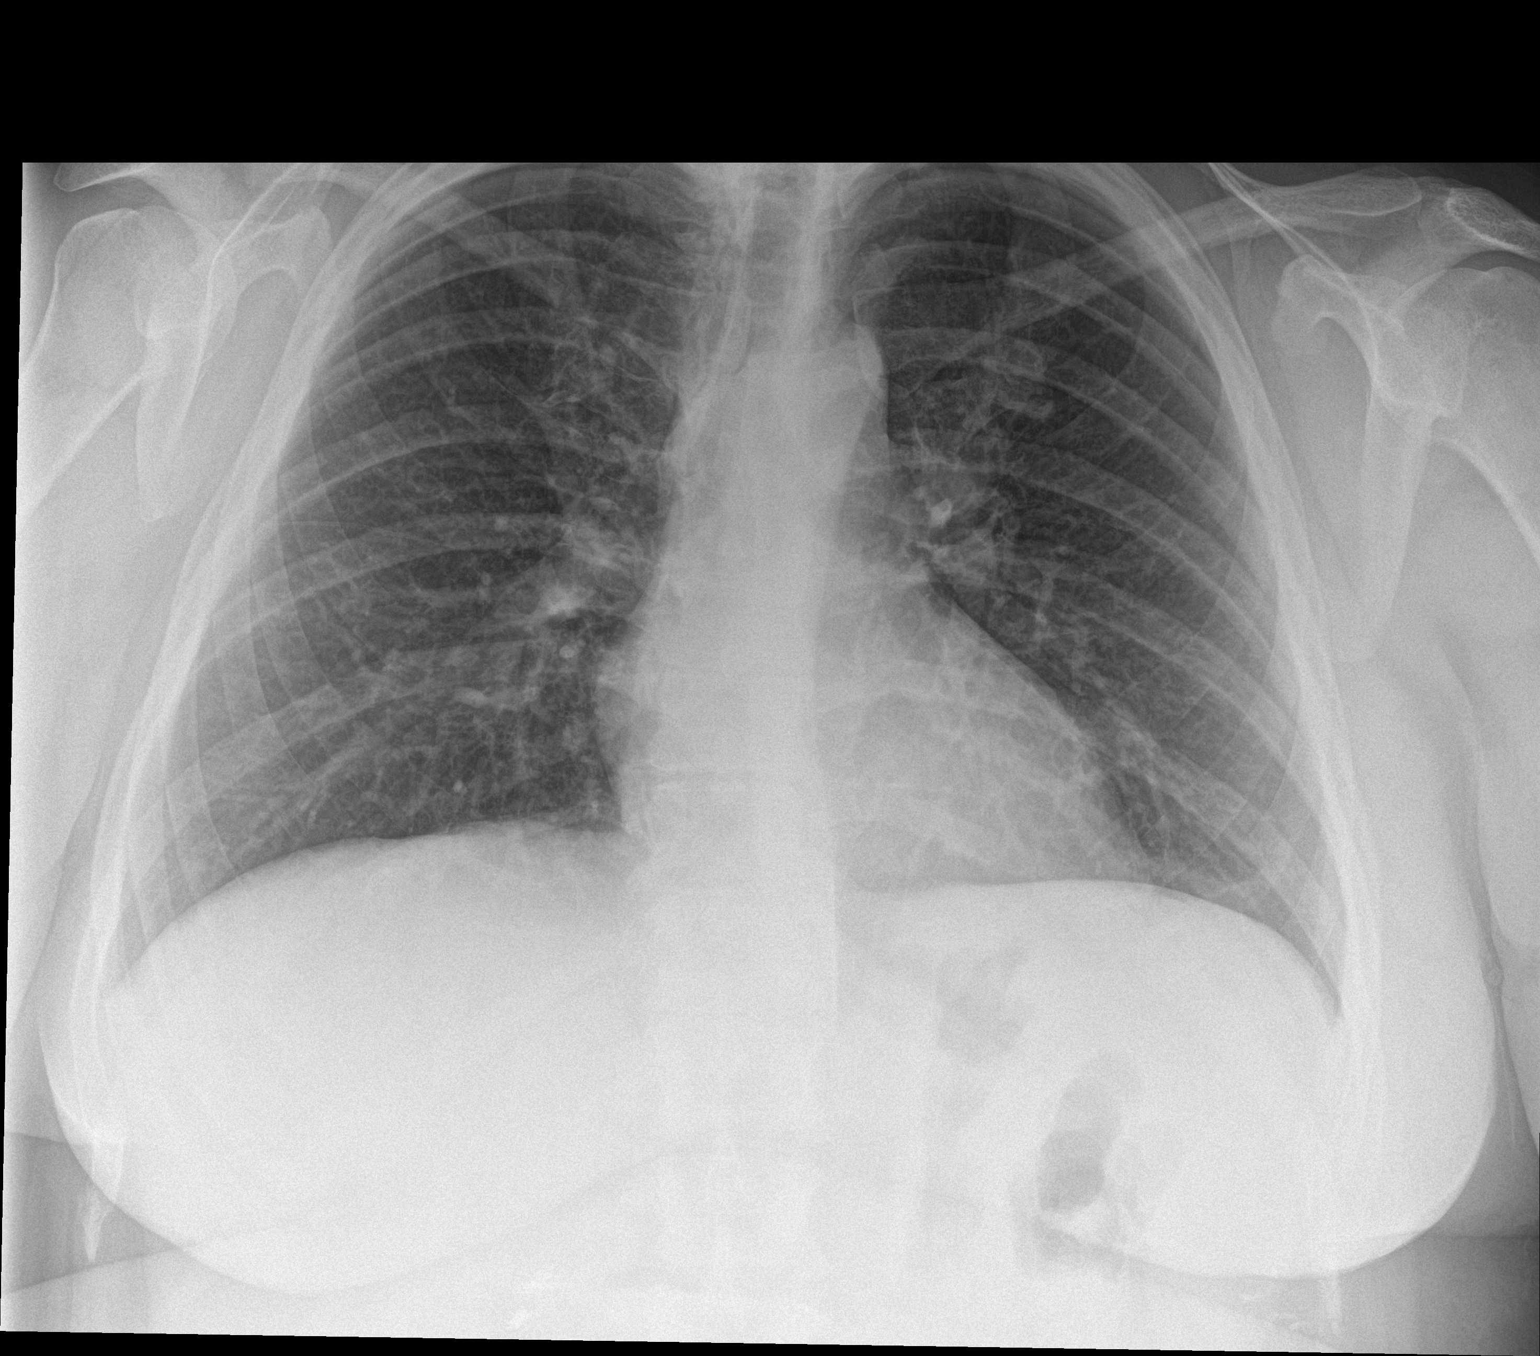

[chest lat]
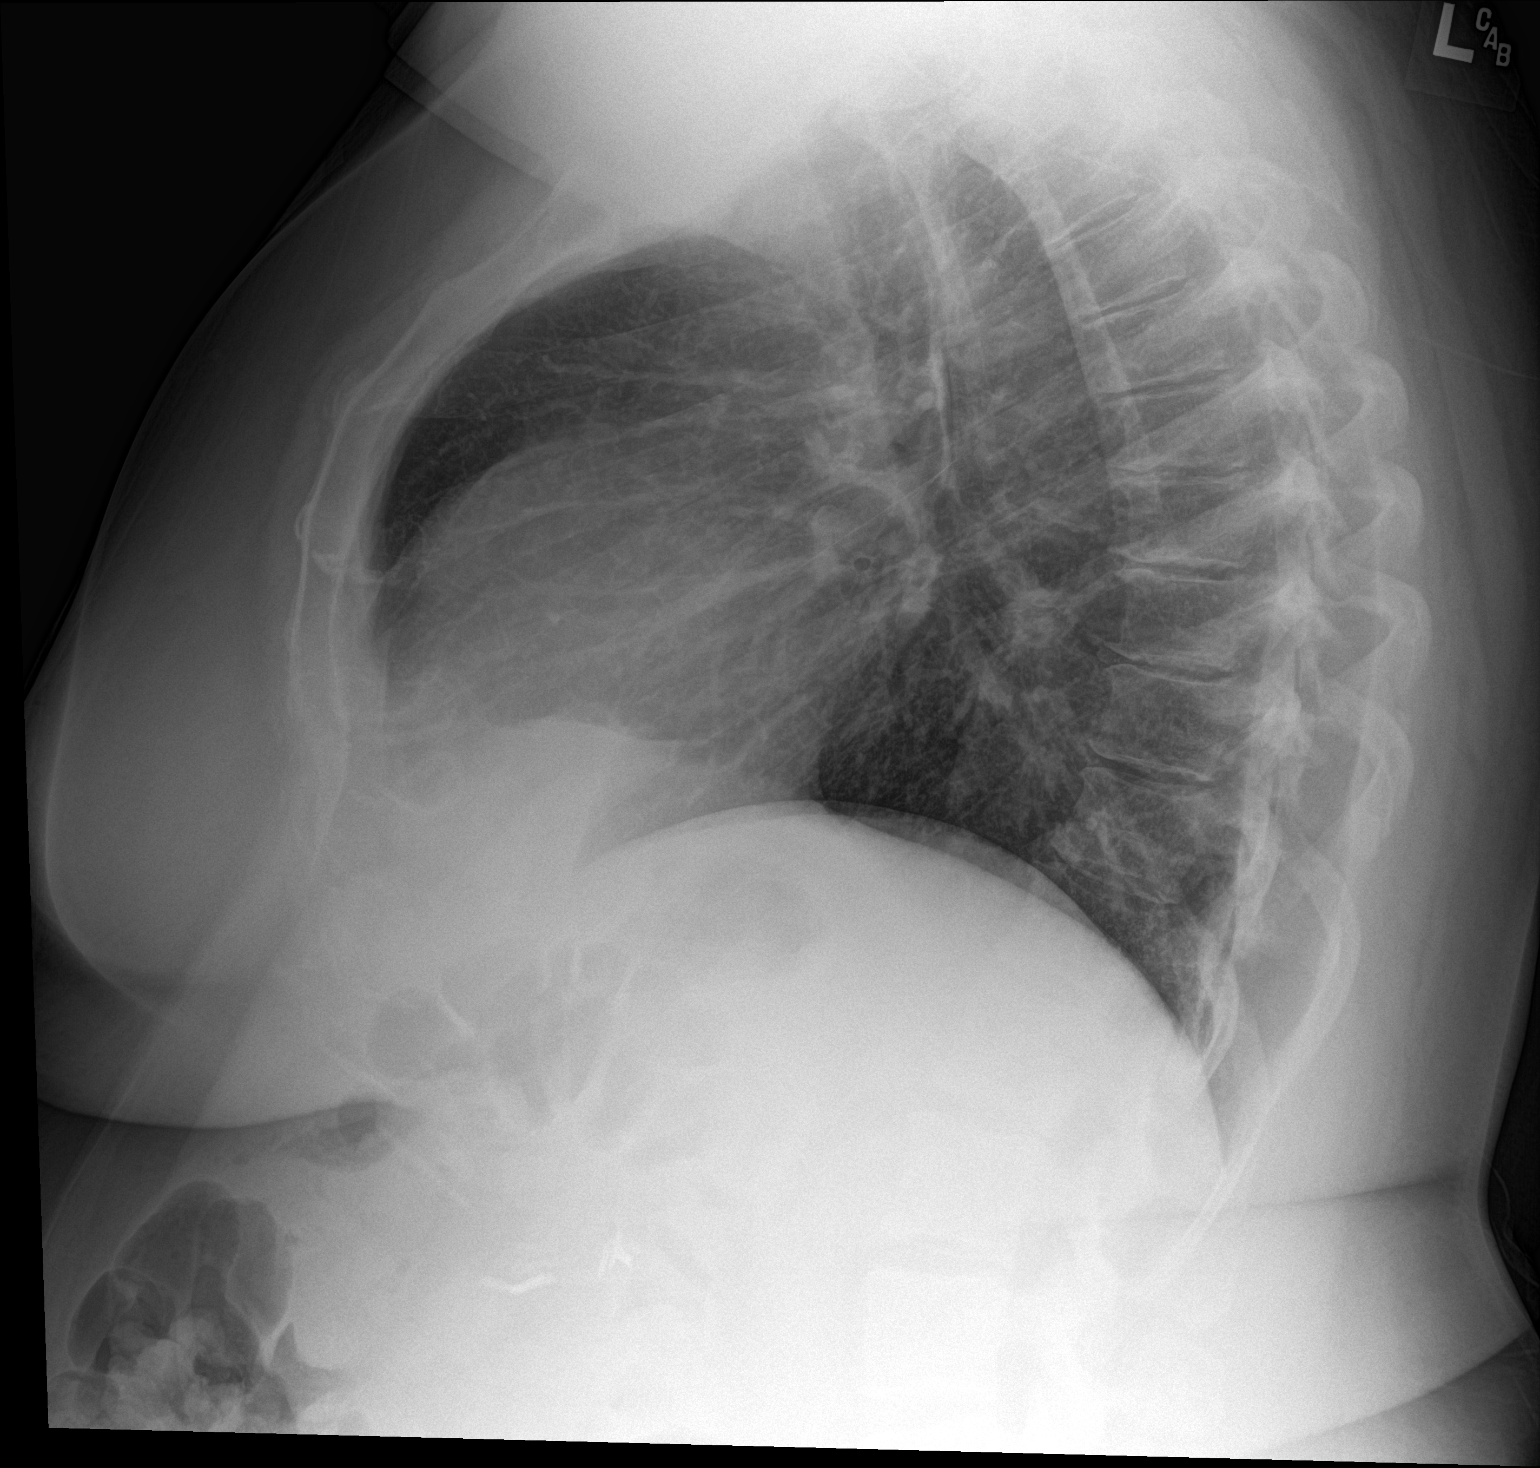

[2 of 2 positions shown; findings below may reference images not displayed]

FINDINGS: The heart size and mediastinal contours are within normal limits.
Both lungs are clear. The visualized skeletal structures are
unremarkable.
IMPRESSION: No active cardiopulmonary disease.

## 2019-11-26 ENCOUNTER — Other Ambulatory Visit: Payer: Self-pay

## 2019-11-26 DIAGNOSIS — Z20822 Contact with and (suspected) exposure to covid-19: Secondary | ICD-10-CM

## 2019-11-28 LAB — NOVEL CORONAVIRUS, NAA: SARS-CoV-2, NAA: NOT DETECTED

## 2022-03-07 ENCOUNTER — Other Ambulatory Visit: Payer: Self-pay

## 2022-03-07 DIAGNOSIS — Z1211 Encounter for screening for malignant neoplasm of colon: Secondary | ICD-10-CM

## 2022-03-07 DIAGNOSIS — Z800 Family history of malignant neoplasm of digestive organs: Secondary | ICD-10-CM

## 2022-03-07 MED ORDER — PEG 3350-KCL-NA BICARB-NACL 420 G PO SOLR: 4000.0000 mL | Freq: Once | ORAL | 0 refills | Status: AC

## 2022-03-07 NOTE — Progress Notes (Signed)
Gastroenterology Pre-Procedure Review ? ?Request Date: 04/04/2022 ?Requesting Physician: Dr. Allegra Lai ? ?PATIENT REVIEW QUESTIONS: The patient responded to the following health history questions as indicated:   ? ?1. Are you having any GI issues? no ?2. Do you have a personal history of Polyps? no ?3. Do you have a family history of Colon Cancer or Polyps? yes (Mother: colon cancer) ?4. Diabetes Mellitus?  Prediabetes ?5. Joint replacements in the past 12 months?no ?6. Major health problems in the past 3 months?no ?7. Any artificial heart valves, MVP, or defibrillator?no ?   ?MEDICATIONS & ALLERGIES:    ?Patient reports the following regarding taking any anticoagulation/antiplatelet therapy:   ?Plavix, Coumadin, Eliquis, Xarelto, Lovenox, Pradaxa, Brilinta, or Effient? no ?Aspirin? no ? ?Patient confirms/reports the following medications:  ?Current Outpatient Medications  ?Medication Sig Dispense Refill  ? cetirizine (ZYRTEC) 10 MG tablet Take 10 mg by mouth daily as needed for allergies.    ? fluticasone (FLONASE) 50 MCG/ACT nasal spray Place 2 sprays into both nostrils daily as needed for allergies or rhinitis.    ? guaiFENesin (MUCINEX) 600 MG 12 hr tablet Take 1,200 mg by mouth 2 (two) times daily as needed for cough or to loosen phlegm.    ? hydrochlorothiazide (HYDRODIURIL) 25 MG tablet Take 25 mg by mouth daily.  12  ? ibuprofen (ADVIL,MOTRIN) 200 MG tablet Take 200-400 mg by mouth every 6 (six) hours as needed for fever or moderate pain.    ? levonorgestrel (MIRENA) 20 MCG/24HR IUD 1 each by Intrauterine route once.    ? lisinopril (PRINIVIL,ZESTRIL) 20 MG tablet Take 20 mg by mouth daily.  11  ? predniSONE (DELTASONE) 20 MG tablet Take 2 tablets (40 mg total) by mouth daily. 10 tablet 0  ? ?No current facility-administered medications for this visit.  ? ? ?Patient confirms/reports the following allergies:  ?Allergies  ?Allergen Reactions  ? Clindamycin/Lincomycin Rash  ? ? ?No orders of the defined types were  placed in this encounter. ? ? ?AUTHORIZATION INFORMATION ?Primary Insurance: ?1D#: ?Group #: ? ?Secondary Insurance: ?1D#: ?Group #: ? ?SCHEDULE INFORMATION: ?Date: 04/04/2022 ?Time: ?Location: [facility] ? ?

## 2022-04-04 ENCOUNTER — Encounter: Payer: Self-pay | Admitting: Gastroenterology

## 2022-04-04 ENCOUNTER — Ambulatory Visit: Payer: Medicaid Other | Admitting: Anesthesiology

## 2022-04-04 ENCOUNTER — Encounter
Admission: RE | Disposition: A | Discharge status: Home / Self Care | Payer: Self-pay | Source: Ambulatory Visit | Attending: Gastroenterology

## 2022-04-04 ENCOUNTER — Ambulatory Visit
Admission: RE | Admit: 2022-04-04 | Discharge: 2022-04-04 | Disposition: A | Discharge status: Home / Self Care | Payer: Medicaid Other | Source: Ambulatory Visit | Attending: Gastroenterology | Admitting: Gastroenterology

## 2022-04-04 ENCOUNTER — Other Ambulatory Visit: Payer: Self-pay

## 2022-04-04 DIAGNOSIS — Z6841 Body Mass Index (BMI) 40.0 and over, adult: Secondary | ICD-10-CM | POA: Insufficient documentation

## 2022-04-04 DIAGNOSIS — Z800 Family history of malignant neoplasm of digestive organs: Secondary | ICD-10-CM | POA: Insufficient documentation

## 2022-04-04 DIAGNOSIS — Z1211 Encounter for screening for malignant neoplasm of colon: Secondary | ICD-10-CM | POA: Diagnosis not present

## 2022-04-04 DIAGNOSIS — I10 Essential (primary) hypertension: Secondary | ICD-10-CM | POA: Diagnosis not present

## 2022-04-04 HISTORY — PX: COLONOSCOPY WITH PROPOFOL: SHX5780

## 2022-04-04 LAB — POCT PREGNANCY, URINE: Preg Test, Ur: NEGATIVE

## 2022-04-04 LAB — GLUCOSE, CAPILLARY: Glucose-Capillary: 143 mg/dL — ABNORMAL HIGH (ref 70–99)

## 2022-04-04 SURGERY — COLONOSCOPY WITH PROPOFOL
Anesthesia: General

## 2022-04-04 MED ORDER — LIDOCAINE HCL (CARDIAC) PF 100 MG/5ML IV SOSY
PREFILLED_SYRINGE | INTRAVENOUS | Status: DC | PRN
  Administered 2022-04-04: 50 mg via INTRAVENOUS

## 2022-04-04 MED ORDER — PROPOFOL 500 MG/50ML IV EMUL
INTRAVENOUS | Status: DC | PRN
  Administered 2022-04-04: 140 ug/kg/min via INTRAVENOUS

## 2022-04-04 MED ORDER — SODIUM CHLORIDE 0.9 % IV SOLN: INTRAVENOUS | Status: DC | PRN

## 2022-04-04 MED ORDER — PROPOFOL 10 MG/ML IV BOLUS
INTRAVENOUS | Status: DC | PRN
  Administered 2022-04-04: 70 mg via INTRAVENOUS
  Administered 2022-04-04: 10 mg via INTRAVENOUS
  Administered 2022-04-04: 20 mg via INTRAVENOUS
  Administered 2022-04-04: 10 mg via INTRAVENOUS
  Administered 2022-04-04: 20 mg via INTRAVENOUS

## 2022-04-04 NOTE — Anesthesia Preprocedure Evaluation (Signed)
Anesthesia Evaluation  ?Patient identified by MRN, date of birth, ID band ?Patient awake ? ? ? ?Reviewed: ?Allergy & Precautions, NPO status , Patient's Chart, lab work & pertinent test results ? ?History of Anesthesia Complications ?Negative for: history of anesthetic complications ? ?Airway ?Mallampati: III ? ?TM Distance: <3 FB ?Neck ROM: full ? ? ? Dental ? ?(+) Chipped ?  ?Pulmonary ?neg pulmonary ROS, neg shortness of breath,  ?  ?Pulmonary exam normal ? ? ? ? ? ? ? Cardiovascular ?hypertension, (-) anginaNormal cardiovascular exam ? ? ?  ?Neuro/Psych ?negative neurological ROS ? negative psych ROS  ? GI/Hepatic ?negative GI ROS, Neg liver ROS, neg GERD  ,  ?Endo/Other  ?Morbid obesity ? Renal/GU ?negative Renal ROS  ?negative genitourinary ?  ?Musculoskeletal ? ? Abdominal ?  ?Peds ? Hematology ?negative hematology ROS ?(+)   ?Anesthesia Other Findings ?Past Medical History: ?No date: Hypertension ? ?Past Surgical History: ?No date: CHOLECYSTECTOMY ? ?BMI   ? Body Mass Index: 51.65 kg/m?  ?  ? ? Reproductive/Obstetrics ?negative OB ROS ? ?  ? ? ? ? ? ? ? ? ? ? ? ? ? ?  ?  ? ? ? ? ? ? ? ? ?Anesthesia Physical ?Anesthesia Plan ? ?ASA: 3 ? ?Anesthesia Plan: General  ? ?Post-op Pain Management:   ? ?Induction: Intravenous ? ?PONV Risk Score and Plan: Propofol infusion and TIVA ? ?Airway Management Planned: Natural Airway and Nasal Cannula ? ?Additional Equipment:  ? ?Intra-op Plan:  ? ?Post-operative Plan:  ? ?Informed Consent: I have reviewed the patients History and Physical, chart, labs and discussed the procedure including the risks, benefits and alternatives for the proposed anesthesia with the patient or authorized representative who has indicated his/her understanding and acceptance.  ? ? ? ?Dental Advisory Given ? ?Plan Discussed with: Anesthesiologist, CRNA and Surgeon ? ?Anesthesia Plan Comments: (Patient consented for risks of anesthesia including but not limited to:   ?- adverse reactions to medications ?- risk of airway placement if required ?- damage to eyes, teeth, lips or other oral mucosa ?- nerve damage due to positioning  ?- sore throat or hoarseness ?- Damage to heart, brain, nerves, lungs, other parts of body or loss of life ? ?Patient voiced understanding.)  ? ? ? ? ? ? ?Anesthesia Quick Evaluation ? ?

## 2022-04-04 NOTE — Transfer of Care (Signed)
Immediate Anesthesia Transfer of Care Note ? ?Patient: [name] ? ?Procedure(s) Performed: COLONOSCOPY WITH PROPOFOL ? ?Patient Location: Endoscopy Unit ? ?Anesthesia Type:General ? ?Level of Consciousness: drowsy ? ?Airway & Oxygen Therapy: Patient Spontanous Breathing ? ?Post-op Assessment: Report given to RN and Post -op Vital signs reviewed and stable ? ?Post vital signs: Reviewed and stable ? ?Last Vitals:  ?Vitals Value Taken Time  ?BP 103/58 04/04/22 0935  ?Temp    ?Pulse 67 04/04/22 0935  ?Resp 22 04/04/22 0935  ?SpO2 94 % 04/04/22 0935  ?Vitals shown include unvalidated device data. ? ?Last Pain:  ?Vitals:  ? 04/04/22 0935  ?TempSrc:   ?PainSc: 0-No pain  ?   ? ?  ? ?Complications: No notable events documented. ?

## 2022-04-04 NOTE — Op Note (Signed)
[facility] ?Gastroenterology ?Patient Name: Chevella Pearce ?Procedure Date: 04/04/2022 8:59 AM ?MRN: [id number] ?Account #: [id number] ?Date of Birth: 1981-01-26 ?Admit Type: Outpatient ?Age: 41 ?Room: [facility] ENDO ROOM 1 ?Gender: Female ?Note Status: Finalized ?Instrument Name: Colonoscope [id number] ?Procedure:             Colonoscopy ?Indications:           Screening in patient at increased risk: Colorectal  ?                       cancer in mother before age 38, Last colonoscopy 10  ?                       years ago ?Providers:             Toney Reil MD, MD ?Medicines:             General Anesthesia ?Complications:         No immediate complications. Estimated blood loss: None. ?Procedure:             Pre-Anesthesia Assessment: ?                       - Prior to the procedure, a History and Physical was  ?                       performed, and patient medications and allergies were  ?                       reviewed. The patient is competent. The risks and  ?                       benefits of the procedure and the sedation options and  ?                       risks were discussed with the patient. All questions  ?                       were answered and informed consent was obtained.  ?                       Patient identification and proposed procedure were  ?                       verified by the physician, the nurse, the  ?                       anesthesiologist, the anesthetist and the technician  ?                       in the pre-procedure area in the procedure room in the  ?                       endoscopy suite. Mental Status Examination: alert and  ?                       oriented. Airway Examination: normal oropharyngeal  ?                       airway and  neck mobility. Respiratory Examination:  ?                       clear to auscultation. CV Examination: normal.  ?                       Prophylactic Antibiotics: The patient does not require  ?                       prophylactic  antibiotics. Prior Anticoagulants: The  ?                       patient has taken no previous anticoagulant or  ?                       antiplatelet agents. ASA Grade Assessment: III - A  ?                       patient with severe systemic disease. After reviewing  ?                       the risks and benefits, the patient was deemed in  ?                       satisfactory condition to undergo the procedure. The  ?                       anesthesia plan was to use general anesthesia.  ?                       Immediately prior to administration of medications,  ?                       the patient was re-assessed for adequacy to receive  ?                       sedatives. The heart rate, respiratory rate, oxygen  ?                       saturations, blood pressure, adequacy of pulmonary  ?                       ventilation, and response to care were monitored  ?                       throughout the procedure. The physical status of the  ?                       patient was re-assessed after the procedure. ?                       After obtaining informed consent, the colonoscope was  ?                       passed under direct vision. Throughout the procedure,  ?                       the patient's blood pressure, pulse, and oxygen  ?  saturations were monitored continuously. The  ?                       Colonoscope was introduced through the anus and  ?                       advanced to the the cecum, identified by appendiceal  ?                       orifice and ileocecal valve. The colonoscopy was  ?                       performed without difficulty. The patient tolerated  ?                       the procedure well. The quality of the bowel  ?                       preparation was adequate to identify polyps 6 mm and  ?                       larger in size. ?Findings: ?     The perianal and digital rectal examinations were normal. Pertinent  ?     negatives include normal sphincter tone and no  palpable rectal lesions. ?     The entire examined colon appeared normal. ?     The retroflexed view of the distal rectum and anal verge was normal and  ?     showed no anal or rectal abnormalities. ?Impression:            - The entire examined colon is normal. ?                       - The distal rectum and anal verge are normal on  ?                       retroflexion view. ?                       - No specimens collected. ?Recommendation:        - Discharge patient to home (with escort). ?                       - Resume previous diet today. ?                       - Continue present medications. ?                       - Repeat colonoscopy in 5 years for screening purposes. ?Procedure Code(s):     --- Professional --- ?                       G0105, Colorectal cancer screening; colonoscopy on  ?                       individual at high risk ?Diagnosis Code(s):     --- Professional --- ?                       Z80.0,  Family history of malignant neoplasm of  ?                       digestive organs ?CPT copyright 2019 American Medical Association. All rights reserved. ?The codes documented in this report are preliminary and upon coder review may  ?be revised to meet current compliance requirements. ?Dr. Libby Maw ?Lizbeth Feijoo Alger Memos MD, MD ?04/04/2022 9:33:13 AM ?This report has been signed electronically. ?Number of Addenda: 0 ?Note Initiated On: 04/04/2022 8:59 AM ?Scope Withdrawal Time: 0 hours 10 minutes 48 seconds  ?Total Procedure Duration: 0 hours 15 minutes 25 seconds  ?Estimated Blood Loss:  Estimated blood loss: none. ?     [facility] ?

## 2022-04-04 NOTE — H&P (Signed)
?Arlyss Repress, MD ?[address]  ?[address]  ?[place], [place] [address]  ?Main: [phone]  ?Fax: [phone] ?Pager: [phone] ? ?Primary Care Physician:  Judeen Hammans, MD ?Primary Gastroenterologist:  Dr. Arlyss Repress ? ?Pre-Procedure History & Physical: ?HPI:  [name] is a 41 y.o. female is here for an colonoscopy. ?  ?Past Medical History:  ?Diagnosis Date  ? Hypertension   ? ? ?Past Surgical History:  ?Procedure Laterality Date  ? CHOLECYSTECTOMY    ? ? ?Prior to Admission medications   ?Medication Sig Start Date End Date Taking? Authorizing Provider  ?cetirizine (ZYRTEC) 10 MG tablet Take 10 mg by mouth daily as needed for allergies.    [provider]  ?fluticasone (FLONASE) 50 MCG/ACT nasal spray Place 2 sprays into both nostrils daily as needed for allergies or rhinitis.    [provider]  ?guaiFENesin (MUCINEX) 600 MG 12 hr tablet Take 1,200 mg by mouth 2 (two) times daily as needed for cough or to loosen phlegm.    [provider]  ?hydrochlorothiazide (HYDRODIURIL) 25 MG tablet Take 25 mg by mouth daily. 11/29/15   [provider]  ?ibuprofen (ADVIL,MOTRIN) 200 MG tablet Take 200-400 mg by mouth every 6 (six) hours as needed for fever or moderate pain.    [provider]  ?levonorgestrel (MIRENA) 20 MCG/24HR IUD 1 each by Intrauterine route once.    [provider]  ?lisinopril (PRINIVIL,ZESTRIL) 20 MG tablet Take 20 mg by mouth daily. 11/29/15   [provider]  ?predniSONE (DELTASONE) 20 MG tablet Take 2 tablets (40 mg total) by mouth daily. 06/17/16   Minna Antis, MD  ? ? ?Allergies as of 03/07/2022 - Review Complete 06/17/2016  ?Allergen Reaction Noted  ? Clindamycin/lincomycin Rash 02/09/2016  ? ? ?History reviewed. No pertinent family history. ? ?Social History  ? ?Socioeconomic History  ? Marital status: Married  ?  Spouse name: Not on file  ? Number of children: Not on file  ? Years of  education: Not on file  ? Highest education level: Not on file  ?Occupational History  ? Not on file  ?Tobacco Use  ? Smoking status: Never  ? Smokeless tobacco: Never  ?Vaping Use  ? Vaping Use: Never used  ?Substance and Sexual Activity  ? Alcohol use: No  ? Drug use: No  ? Sexual activity: Not on file  ?Other Topics Concern  ? Not on file  ?Social History Narrative  ? Not on file  ? ?Social Determinants of Health  ? ?Financial Resource Strain: Not on file  ?Food Insecurity: Not on file  ?Transportation Needs: Not on file  ?Physical Activity: Not on file  ?Stress: Not on file  ?Social Connections: Not on file  ?Intimate Partner Violence: Not on file  ? ? ?Review of Systems: ?See HPI, otherwise negative ROS ? ?Physical Exam: ?BP (!) 144/97   Pulse 77   Temp 98.2 ?F (36.8 ?C) (Temporal)   Resp 20   Ht 5\' 6"  (1.676 m)   Wt (!) 145.2 kg   SpO2 96%   BMI 51.65 kg/m?  ?General:   Alert,  pleasant and cooperative in NAD ?Head:  Normocephalic and atraumatic. ?Neck:  Supple; no masses or thyromegaly. ?Lungs:  Clear throughout to auscultation.    ?Heart:  Regular rate and rhythm. ?Abdomen:  Soft, nontender and nondistended. Normal bowel sounds, without guarding, and without rebound.   ?Neurologic:  Alert and  oriented x4;  grossly normal neurologically. ? ?  Impression/Plan: ?[name] is here for an colonoscopy to be performed for colon cancer screening ? ?Risks, benefits, limitations, and alternatives regarding  colonoscopy have been reviewed with the patient.  Questions have been answered.  All parties agreeable. ? ? ?Lannette Donath, MD  04/04/2022, 8:58 AM ?

## 2022-04-04 NOTE — Anesthesia Procedure Notes (Signed)
Date/Time: 04/04/2022 9:11 AM ?Performed by: Joanette Gula, Oron Westrup, CRNA ?Pre-anesthesia Checklist: Patient identified, Emergency Drugs available, Suction available, Patient being monitored and Timeout performed ?Patient Re-evaluated:Patient Re-evaluated prior to induction ?Oxygen Delivery Method: Nasal cannula ?Induction Type: IV induction ? ? ? ? ?

## 2022-04-04 NOTE — Anesthesia Postprocedure Evaluation (Signed)
Anesthesia Post Note ? ?Patient: CANDIACE WEST ? ?Procedure(s) Performed: COLONOSCOPY WITH PROPOFOL ? ?Patient location during evaluation: Endoscopy ?Anesthesia Type: General ?Level of consciousness: awake and alert ?Pain management: pain level controlled ?Vital Signs Assessment: post-procedure vital signs reviewed and stable ?Respiratory status: spontaneous breathing, nonlabored ventilation, respiratory function stable and patient connected to nasal cannula oxygen ?Cardiovascular status: blood pressure returned to baseline and stable ?Postop Assessment: no apparent nausea or vomiting ?Anesthetic complications: no ? ? ?No notable events documented. ? ? ?Last Vitals:  ?Vitals:  ? 04/04/22 0935 04/04/22 1004  ?BP: (!) 103/58 135/72  ?Pulse: 71   ?Resp:    ?Temp:    ?SpO2:    ?  ?Last Pain:  ?Vitals:  ? 04/04/22 1004  ?TempSrc:   ?PainSc: 0-No pain  ? ? ?  ?  ?  ?  ?  ?  ? ?Cleda Mccreedy [facility] ? ? ? ? ?

## 2022-04-05 ENCOUNTER — Encounter: Payer: Self-pay | Admitting: Gastroenterology

## 2023-11-22 ENCOUNTER — Other Ambulatory Visit: Payer: Self-pay | Admitting: Family Medicine

## 2023-11-22 DIAGNOSIS — Z1231 Encounter for screening mammogram for malignant neoplasm of breast: Secondary | ICD-10-CM

## 2024-06-12 ENCOUNTER — Other Ambulatory Visit: Payer: Self-pay | Admitting: Nurse Practitioner

## 2024-06-12 DIAGNOSIS — Z1231 Encounter for screening mammogram for malignant neoplasm of breast: Secondary | ICD-10-CM
